# Patient Record
Sex: Female | Born: 2000 | Race: White | Hispanic: No | Marital: Single | Smoking: Never smoker
Health system: Southern US, Community
[De-identification: ages and names within clinical notes are randomized; demographics above are authoritative.]

## PROBLEM LIST (undated history)

## (undated) DIAGNOSIS — G43909 Migraine, unspecified, not intractable, without status migrainosus: Secondary | ICD-10-CM

## (undated) HISTORY — PX: NO PAST SURGERIES: SHX2092

## (undated) HISTORY — DX: Migraine, unspecified, not intractable, without status migrainosus: G43.909

---
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
## 2020-01-01 ENCOUNTER — Other Ambulatory Visit (INDEPENDENT_AMBULATORY_CARE_PROVIDER_SITE_OTHER): Payer: Self-pay | Admitting: Neurology

## 2020-01-29 ENCOUNTER — Other Ambulatory Visit (INDEPENDENT_AMBULATORY_CARE_PROVIDER_SITE_OTHER): Payer: Self-pay | Admitting: Neurology

## 2020-02-01 ENCOUNTER — Encounter (INDEPENDENT_AMBULATORY_CARE_PROVIDER_SITE_OTHER): Payer: Self-pay | Admitting: Neurology

## 2020-02-01 ENCOUNTER — Telehealth (INDEPENDENT_AMBULATORY_CARE_PROVIDER_SITE_OTHER): Payer: 59 | Admitting: Neurology

## 2020-02-01 VITALS — Ht 64.75 in | Wt 153.0 lb

## 2020-02-01 DIAGNOSIS — R519 Headache, unspecified: Secondary | ICD-10-CM

## 2020-02-01 DIAGNOSIS — G44209 Tension-type headache, unspecified, not intractable: Secondary | ICD-10-CM

## 2020-02-01 DIAGNOSIS — G43009 Migraine without aura, not intractable, without status migrainosus: Secondary | ICD-10-CM

## 2020-02-01 MED ORDER — AMITRIPTYLINE HCL 25 MG PO TABS: ORAL_TABLET | ORAL | 5 refills | Status: DC

## 2020-02-01 NOTE — Progress Notes (Signed)
This is a Pediatric Specialist E-Visit follow up consult provided via My Chart Ramond Craver and their parent/guardian   consented to an E-Visit consult today.  Location of patient: [place] is at Home(location) Location of provider: Keturah Shavers, MD is at Office (location) Patient was referred by Gwenlyn Found, MD   The following participants were involved in this E-Visit: Marva Panda, CMA              Keturah Shavers, MD Chief Complain/ Reason for E-Visit today: headache Total time on call: 20 minutes Follow up: Around 4 months in the office  Patient: Vallorie Niccoli MRN: [id number] Sex: female DOB: 2000/04/16  Provider: Keturah Shavers, MD Location of Care: [facility]  Note type: Routine return visit History from: patient and [facility] chart Chief Complaint: Brett Fairy, MD  History of Present Illness: Aneliese Beaudry is a 20 y.o. female is on video for follow-up management of headache and adjusting the medication.  She has been having frequent and chronic migraine and tension type headaches which at some point was very frequent so the dose of her preventive medication amitriptyline increased gradually to 50 mg every night which she was taking regularly until around 2 months ago when she ran out of medication and since then she has not been on any medication. She was last seen in May 2021 when the dose of medication increased and she was doing fairly well with on average 3 or 4 headaches each month but since she discontinued the medication about 2 months ago she has been having more frequent headaches and probably 3 or 4 headaches each week which for some of them she needs to take OTC medications. She usually sleeps well without any difficulty and with no awakening although she mentioned that she was sleeping better when she was taking the amitriptyline.  She did not have any side effects of medication and she thinks that the medication was helping her fairly  significant with the headache frequency and intensity. Currently she is in college and [employer] and doing well otherwise and she would like to start medication again since she is having more frequent headaches.  Review of Systems: 12 system review as per HPI, otherwise negative.  History reviewed. No pertinent past medical history. Hospitalizations: No., Head Injury: No., Nervous System Infections: No., Immunizations up to date: Yes.     Surgical History Past Surgical History:  Procedure Laterality Date  . NO PAST SURGERIES      Family History family history includes ADD / ADHD in her brother, father, and mother; Bipolar disorder in her maternal grandmother and paternal grandfather.   Social History Social History   Socioeconomic History  . Marital status: Single    Spouse name: Not on file  . Number of children: Not on file  . Years of education: Not on file  . Highest education level: Not on file  Occupational History  . Not on file  Tobacco Use  . Smoking status: Passive Smoke Exposure - Never Smoker  . Smokeless tobacco: Never Used  Substance and Sexual Activity  . Alcohol use: Not on file  . Drug use: Not on file  . Sexual activity: Not on file  Other Topics Concern  . Not on file  Social History Narrative   Lives with mom,dad and brother. She attends [employer] She does well in school. She enjoys being outside, swimming, and playing with her dog.   Social Determinants of Health   Financial Resource Strain: Not  on file  Food Insecurity: Not on file  Transportation Needs: Not on file  Physical Activity: Not on file  Stress: Not on file  Social Connections: Not on file     The medication list was reviewed and reconciled. All changes or newly prescribed medications were explained.  A complete medication list was provided to the patient/caregiver.  Allergies  Allergen Reactions  . Other Rash and Other (See Comments)    Mushrooms Mushrooms    Physical Exam Ht 5'  4.75" (1.645 m)   Wt 153 lb (69.4 kg)   BMI 25.66 kg/m  Her very limited exam is unremarkable, she was awake, alert, follows instructions appropriately with normal comprehension and fluent speech.  She had normal cranial nerves with symmetric face and no nystagmus.  She had no tremor with normal range of motion.  Assessment and Plan 1. Migraine without aura and without status migrainosus, not intractable   2. Chronic daily headache   3. Tension headache     This is a 20 year old female with episodes of chronic migraine and tension type headaches with increased intensity and frequency since discontinuing medication a couple of months ago.  She has no findings on her limited neurological exam. I would like to start her on medication again but I would start her on slightly lower dose of medication at 37.5 mg every night and see how she does. She may also benefit from restarting dietary supplements such as magnesium and vitamin B2 or co-Q10. She needs to have more hydration with adequate sleep and limited screen time She will make a headache diary and bring it on her next visit She may take occasional Tylenol or ibuprofen for moderate to severe headache She will call my office if she develops more frequent headaches otherwise I would like to see her in 4 to 5 months for follow-up visit in the office.  She understood and agreed with the plan.    Meds ordered this encounter  Medications  . amitriptyline (ELAVIL) 25 MG tablet    Sig: Take 37.5 mg or 1.5 tablet every night    Dispense:  46 tablet    Refill:  5

## 2020-02-01 NOTE — Patient Instructions (Signed)
Since the headaches are getting more frequent off of medication, I will start the same medication but with a slightly lower dose of 37.5 mg or 1.5 tablet every night Start taking dietary supplements again such as magnesium and co-Q10 Continue with more hydration, adequate sleep and limited screen time Make a headache diary May take occasional Tylenol or ibuprofen for moderate to severe headache Return in 4 months for follow-up visit

## 2020-05-01 ENCOUNTER — Other Ambulatory Visit (INDEPENDENT_AMBULATORY_CARE_PROVIDER_SITE_OTHER): Payer: Self-pay | Admitting: Neurology

## 2020-05-01 NOTE — Telephone Encounter (Signed)
Request for 90 days.

## 2020-08-04 ENCOUNTER — Other Ambulatory Visit: Payer: Self-pay

## 2020-08-04 ENCOUNTER — Encounter: Payer: Self-pay | Admitting: Neurology

## 2020-08-04 ENCOUNTER — Ambulatory Visit (INDEPENDENT_AMBULATORY_CARE_PROVIDER_SITE_OTHER): Payer: 59 | Admitting: Neurology

## 2020-08-04 VITALS — BP 110/70 | HR 94 | Ht 65.0 in | Wt 158.0 lb

## 2020-08-04 DIAGNOSIS — G43709 Chronic migraine without aura, not intractable, without status migrainosus: Secondary | ICD-10-CM | POA: Diagnosis not present

## 2020-08-04 MED ORDER — ONDANSETRON 4 MG PO TBDP: 4.0000 mg | ORAL_TABLET | Freq: Three times a day (TID) | ORAL | 3 refills | Status: DC | PRN

## 2020-08-04 MED ORDER — RIZATRIPTAN BENZOATE 10 MG PO TBDP: 10.0000 mg | ORAL_TABLET | ORAL | 11 refills | Status: DC | PRN

## 2020-08-04 MED ORDER — ONDANSETRON 4 MG PO TBDP: 4.0000 mg | ORAL_TABLET | Freq: Three times a day (TID) | ORAL | 3 refills | Status: AC | PRN

## 2020-08-04 MED ORDER — TOPIRAMATE 50 MG PO TABS: ORAL_TABLET | ORAL | 3 refills | Status: DC

## 2020-08-04 MED ORDER — RIZATRIPTAN BENZOATE 10 MG PO TBDP: 10.0000 mg | ORAL_TABLET | ORAL | 11 refills | Status: AC | PRN

## 2020-08-04 NOTE — Progress Notes (Signed)
[id number]    Provider:  Dr Lucia Gaskins Requesting Provider: Gwenlyn Found, MD Primary Care Provider:  Gwenlyn Found, MD  CC:  migraines  HPI:  Phyllis Williams is a 20 y.o. female here as requested by Gwenlyn Found, MD for migraines. PMHx migraines, chronic daily headache, tension headache. She has been a patient here at [facility] with pediatric neurologist and is establishing care with [facility].She last saw neurology last summer. She has only tried amitriptyline and it is not working. She has 20 headache days a month. She has severe migraines, photo/phonophobia, a dark room helps, nausea, pounding/pulsating, one side behind one eye or either side the front behind the eyes, sleep helps but can last up to 24 hours, moderately severe to severe, 8 migraines days a month going on for years since middle school. No known family hx of migraines, mother does have headaches. No auras.  Reviewed notes, labs and imaging from outside physicians, which showed:   From a thorough review of records, medications tried that can be used in migraine management includes: amitrip, mag ox, b2, continuous birth control, propranolol is contraindicated, sumatriptan   MRI 04/10/2017: showed No acute intracranial abnormalities including mass lesion or mass effect, hydrocephalus, extra-axial fluid collection, midline shift, hemorrhage, or acute infarction, large ischemic events (personally reviewed report, no images available currently); normal.    Review of Systems: Patient complains of symptoms per HPI as well as the following symptoms headaches. Pertinent negatives and positives per HPI. All others negative.   Social History   Socioeconomic History   Marital status: Single    Spouse name: Not on file   Number of children: Not on file   Years of education: Not on file   Highest education level: Not on file  Occupational History   Not on file  Tobacco Use   Smoking status: Never    Passive  exposure: Yes   Smokeless tobacco: Never  Substance and Sexual Activity   Alcohol use: Not on file   Drug use: Not on file   Sexual activity: Not on file  Other Topics Concern   Not on file  Social History Narrative   Lives with mom,dad and brother. She attends incoming sophomore [employer] She does well in school. She enjoys being outside, swimming, and playing with her dog. Caffeine coffee occasionally.  Work self employed.    Social Determinants of Health   Financial Resource Strain: Not on file  Food Insecurity: Not on file  Transportation Needs: Not on file  Physical Activity: Not on file  Stress: Not on file  Social Connections: Not on file  Intimate Partner Violence: Not on file    Family History  Problem Relation Age of Onset   ADD / ADHD Mother    ADD / ADHD Father    ADD / ADHD Brother    Bipolar disorder Maternal Grandmother    Bipolar disorder Paternal Grandfather    Migraines Neg Hx    Seizures Neg Hx    Autism Neg Hx    Anal fissures Neg Hx    Anxiety disorder Neg Hx    Depression Neg Hx    Schizophrenia Neg Hx     Past Medical History:  Diagnosis Date   Migraine     Patient Active Problem List   Diagnosis Date Noted   Changing pigmented skin lesion 09/05/2018   Chronic daily headache 05/05/2017   Migraine without aura and without status migrainosus, not intractable 05/05/2017   Tension headache  05/05/2017    Past Surgical History:  Procedure Laterality Date   NO PAST SURGERIES      Current Outpatient Medications  Medication Sig Dispense Refill   amitriptyline (ELAVIL) 25 MG tablet TAKE 37.5 MG OR 1.5 TABLET EVERY NIGHT 138 tablet 0   medroxyPROGESTERone (DEPO-PROVERA) 150 MG/ML injection Inject 150 mg into the muscle every 3 (three) months.     topiramate (TOPAMAX) 50 MG tablet Start with 1 tab (50mg ) for 2 weeks then increase to 2 tabs (100mg ) at bedtime 180 tablet 3   ondansetron (ZOFRAN-ODT) 4 MG disintegrating tablet Take 1-2 tablets (4-8 mg  total) by mouth every 8 (eight) hours as needed. May take with Rizatriptan. 30 tablet 3   rizatriptan (MAXALT-MLT) 10 MG disintegrating tablet Take 1 tablet (10 mg total) by mouth as needed for migraine. May repeat in 2 hours if needed. May take with ondansetron and over the counter analgesics (advil, excedrin, tylenol etc) 9 tablet 11   No current facility-administered medications for this visit.    Allergies as of 08/04/2020 - Review Complete 08/04/2020  Allergen Reaction Noted   Other Rash and Other (See Comments) 05/05/2017    Vitals: BP 110/70   Pulse 94   Ht 5\' 5"  (1.651 m)   Wt 158 lb (71.7 kg)   BMI 26.29 kg/m  Last Weight:  Wt Readings from Last 1 Encounters:  08/04/20 158 lb (71.7 kg) (86 %, Z= 1.08)*   * Growth percentiles are based on CDC (Girls, 2-20 Years) data.   Last Height:   Ht Readings from Last 1 Encounters:  08/04/20 5\' 5"  (1.651 m) (61 %, Z= 0.28)*   * Growth percentiles are based on CDC (Girls, 2-20 Years) data.     Physical exam: Exam: Gen: NAD, conversant, well nourised, well groomed                     CV: RRR, no MRG. No Carotid Bruits. No peripheral edema, warm, nontender Eyes: Conjunctivae clear without exudates or hemorrhage  Neuro: Detailed Neurologic Exam  Speech:    Speech is normal; fluent and spontaneous with normal comprehension.  Cognition:    The patient is oriented to person, place, and time;     recent and remote memory intact;     language fluent;     normal attention, concentration,     fund of knowledge Cranial Nerves:    The pupils are equal, round, and reactive to light. The fundi are normal and spontaneous venous pulsations are present. Visual fields are full to finger confrontation. Extraocular movements are intact. Trigeminal sensation is intact and the muscles of mastication are normal. The face is symmetric. The palate elevates in the midline. Hearing intact. Voice is normal. Shoulder shrug is normal. The tongue has  normal motion without fasciculations.   Coordination:    Normal   Gait:    normal.   Motor Observation:    No asymmetry, no atrophy, and no involuntary movements noted. Tone:    Normal muscle tone.    Posture:    Posture is normal. normal erect    Strength:    Strength is V/V in the upper and lower limbs.      Sensation: intact to LT     Reflex Exam:  DTR's:    Deep tendon reflexes in the upper and lower extremities are normal bilaterally.   Toes:    The toes are downgoing bilaterally.   Clonus:    Clonus is absent.  Assessment/Plan:  Patient with Chronic Migraines.   Prevention: Topamax start 50mg  at bedtime and in 2-3 weeks if no side effects 100mg .  If Topamax ineffective or has significant side effects switch to the Emgality or Ajovy  Acute management: Maxalt/Rizatriptan and ondansetron  Extended visit, new migraine patient, reviewed migraines and migraine management:  To prevent or relieve headaches, try the following: Cool Compress. Lie down and place a cool compress on your head.  Avoid headache triggers. If certain foods or odors seem to have triggered your migraines in the past, avoid them. A headache diary might help you identify triggers.  Include physical activity in your daily routine. Try a daily walk or other moderate aerobic exercise.  Manage stress. Find healthy ways to cope with the stressors, such as delegating tasks on your to-do list.  Practice relaxation techniques. Try deep breathing, yoga, massage and visualization.  Eat regularly. Eating regularly scheduled meals and maintaining a healthy diet might help prevent headaches. Also, drink plenty of fluids.  Follow a regular sleep schedule. Sleep deprivation might contribute to headaches Consider biofeedback. With this mind-body technique, you learn to control certain bodily functions -- such as muscle tension, heart rate and blood pressure -- to prevent headaches or reduce headache pain.     Proceed to emergency room if you experience new or worsening symptoms or symptoms do not resolve, if you have new neurologic symptoms or if headache is severe, or for any concerning symptom.   Provided education and documentation from American headache Society toolbox including articles on: chronic migraine medication overuse headache, chronic migraines, prevention of migraines, behavioral and other nonpharmacologic treatments for headache.   Orders Placed This Encounter  Procedures   Comprehensive metabolic panel   CBC with Differential/Platelets   TSH    Meds ordered this encounter  Medications   DISCONTD: rizatriptan (MAXALT-MLT) 10 MG disintegrating tablet    Sig: Take 1 tablet (10 mg total) by mouth as needed for migraine. May repeat in 2 hours if needed    Dispense:  9 tablet    Refill:  11   DISCONTD: ondansetron (ZOFRAN-ODT) 4 MG disintegrating tablet    Sig: Take 1-2 tablets (4-8 mg total) by mouth every 8 (eight) hours as needed.    Dispense:  30 tablet    Refill:  3   topiramate (TOPAMAX) 50 MG tablet    Sig: Start with 1 tab (50mg ) for 2 weeks then increase to 2 tabs (100mg ) at bedtime    Dispense:  180 tablet    Refill:  3   ondansetron (ZOFRAN-ODT) 4 MG disintegrating tablet    Sig: Take 1-2 tablets (4-8 mg total) by mouth every 8 (eight) hours as needed. May take with Rizatriptan.    Dispense:  30 tablet    Refill:  3    Please [place] in [place]   rizatriptan (MAXALT-MLT) 10 MG disintegrating tablet    Sig: Take 1 tablet (10 mg total) by mouth as needed for migraine. May repeat in 2 hours if needed. May take with ondansetron and over the counter analgesics (advil, excedrin, tylenol etc)    Dispense:  9 tablet    Refill:  11    Please fill [place]     Cc: Eksir, Nira RetortSamantha A, MD,  Gwenlyn FoundEksir, Samantha A, MD  Naomie DeanAntonia Johann Gascoigne, MD  [facility] [address] [place], [place]  [id number]  Phone [phone] Fax [phone]  I spent 60 minutes of face-to-face  and non-face-to-face time with patient on the  1. Chronic migraine without aura without status migrainosus, not intractable    diagnosis.  This included previsit chart review, lab review, study review, order entry, electronic health record documentation, patient education on the different diagnostic and therapeutic options, counseling and coordination of care, risks and benefits of management, compliance, or risk factor reduction

## 2020-08-04 NOTE — Patient Instructions (Addendum)
[email or web address] (dry needling) for neck muscles  - Acute/Emergency: Rizatriptan: Please take one tablet at the onset of your headache. If it does not improve the symptoms please take one additional tablet. Do not take more then 2 tablets in 24hrs. Do not take use more then 2 to 3 times in a week. - Ondansetron: May take with the Rizatriptan or alone for nausea. - For acute/emergency may also consider Nurtec or Ubrelvy In the furutre or a trial of other triptans.  Preventative: Topiramate. Next would try Emgality or Ajovy.   Rizatriptan Disintegrating Tablets What is this medication? RIZATRIPTAN (rye za TRIP tan) treats migraines. It works by blocking pain signals and narrowing blood vessels in the brain. It belongs to a group ofmedications called triptans. It is not used to prevent migraines. This medicine may be used for other purposes; ask your health care provider orpharmacist if you have questions. COMMON BRAND NAME(S): Maxalt-MLT What should I tell my care team before I take this medication? They need to know if you have any of these conditions: Cigarette smoker Circulation problems in fingers and toes Diabetes Heart disease High blood pressure High cholesterol History of irregular heartbeat History of stroke Kidney disease Liver disease Stomach or intestine problems An unusual or allergic reaction to rizatriptan, other medications, foods, dyes, or preservatives Pregnant or trying to get pregnant Breast-feeding How should I use this medication? Take this medication by mouth. Follow the directions on the prescription label. Leave the tablet in the sealed blister pack until you are ready to take it. With dry hands, open the blister and gently remove the tablet. If the tablet breaks or crumbles, throw it away and take a new tablet out of the blister pack. Place the tablet in the mouth and allow it to dissolve, and then swallow. Do not cut, crush, or chew this medication. You do not  need water to take thismedication. Do not take it more often than directed. Talk to your care team regarding the use of this medication in children. While this medication may be prescribed for children as young as 6 years for selectedconditions, precautions do apply. Overdosage: If you think you have taken too much of this medicine contact apoison control center or emergency room at once. NOTE: This medicine is only for you. Do not share this medicine with others. What if I miss a dose? This does not apply. This medication is not for regular use. What may interact with this medication? Do not take this medication with any of the following medications: Certain medications for migraine headache like almotriptan, eletriptan, frovatriptan, naratriptan, rizatriptan, sumatriptan, zolmitriptan Ergot alkaloids like dihydroergotamine, ergonovine, ergotamine, methylergonovine MAOIs like Carbex, Eldepryl, Marplan, Nardil, and Parnate This medication may also interact with the following medications: Certain medications for depression, anxiety, or psychotic disorders Propranolol This list may not describe all possible interactions. Give your health care provider a list of all the medicines, herbs, non-prescription drugs, or dietary supplements you use. Also tell them if you smoke, drink alcohol, or use illegaldrugs. Some items may interact with your medicine. What should I watch for while using this medication? Visit your care team for regular checks on your progress. Tell your care teamif your symptoms do not start to get better or if they get worse. You may get drowsy or dizzy. Do not drive, use machinery, or do anything that needs mental alertness until you know how this medication affects you. Do not stand up or sit up quickly, especially if you are an  older patient. This reduces the risk of dizzy or fainting spells. Alcohol may interfere with theeffect of this medication. Your mouth may get dry. Chewing  sugarless gum or sucking hard candy and drinking plenty of water may help. Contact your care team if the problem doesnot go away or is severe. If you take migraine medications for 10 or more days a month, your migraines may get worse. Keep a diary of headache days and medication use. Contact yourcare team if your migraine attacks occur more frequently. What side effects may I notice from receiving this medication? Side effects that you should report to your care team as soon as possible: Allergic reactions-skin rash, itching, hives, swelling of the face, lips, tongue, or throat Burning, pain, tingling, or color changes in the legs or feet Heart attack-pain or tightness in the chest, shoulders, arms, or jaw, nausea, shortness of breath, cold or clammy skin, feeling faint or lightheaded Heart rhythm changes-fast or irregular heartbeat, dizziness, feeling faint or lightheaded, chest pain, trouble breathing Increase in blood pressure Irritability, confusion, fast or irregular heartbeat, muscle stiffness, twitching muscles, sweating, high fever, seizure, chills, vomiting, diarrhea, which may be signs of serotonin syndrome Raynaud's-cool, numb, or painful fingers or toes that may change color from pale, to blue, to red Seizures Stroke-sudden numbness or weakness of the face, arm, or leg, trouble speaking, confusion, trouble walking, loss of balance or coordination, dizziness, severe headache, change in vision Sudden or severe stomach pain, nausea, vomiting, fever, or bloody diarrhea Vision loss Side effects that usually do not require medical attention (report to your careteam if they continue or are bothersome): Dizziness General discomfort or fatigue This list may not describe all possible side effects. Call your doctor for medical advice about side effects. You may report side effects to FDA at1-800-FDA-1088. Where should I keep my medication? Keep out of the reach of children and pets. Store at  room temperature between 15 and 30 degrees C (59 and 86 degrees F). Protect from light and moisture. Throw away any unused medication after theexpiration date. NOTE: This sheet is a summary. It may not cover all possible information. If you have questions about this medicine, talk to your doctor, pharmacist, orhealth care provider.  2022 Elsevier/Gold Standard (2020-01-30 16:19:19) Ondansetron Dissolving Tablets What is this medication? ONDANSETRON (on DAN se tron) prevents nausea and vomiting from chemotherapy, radiation, or surgery. It works by blocking substances in the body that may cause nausea or vomiting. It belongs to a group of medications calledantiemetics. This medicine may be used for other purposes; ask your health care provider orpharmacist if you have questions. COMMON BRAND NAME(S): Zofran ODT What should I tell my care team before I take this medication? They need to know if you have any of these conditions: Heart disease History of irregular heartbeat Liver disease Low levels of magnesium or potassium in the blood An unusual or allergic reaction to ondansetron, granisetron, other medications, foods, dyes, or preservatives Pregnant or trying to get pregnant Breast-feeding How should I use this medication? These tablets are made to dissolve in the mouth. Do not try to push the tablet through the foil backing. With dry hands, peel away the foil backing and gently remove the tablet. Place the tablet in the mouth and allow it to dissolve, then swallow. While you may take these tablets with water, it is not necessary to doso. Talk to your care team regarding the use of this medication in children.Special care may be needed. Overdosage: If you  think you have taken too much of this medicine contact apoison control center or emergency room at once. NOTE: This medicine is only for you. Do not share this medicine with others. What if I miss a dose? If you miss a dose, take it as soon  as you can. If it is almost time for yournext dose, take only that dose. Do not take double or extra doses. What may interact with this medication? Do not take this medication with any of the following: Apomorphine Certain medications for fungal infections like fluconazole, itraconazole, ketoconazole, posaconazole, voriconazole Cisapride Dronedarone Pimozide Thioridazine This medication may also interact with the following: Carbamazepine Certain medications for depression, anxiety, or psychotic disturbances Fentanyl Linezolid MAOIs like Carbex, Eldepryl, Marplan, Nardil, and Parnate Methylene blue (injected into a vein) Other medications that prolong the QT interval (cause an abnormal heart rhythm) like dofetilide, ziprasidone Phenytoin Rifampicin Tramadol This list may not describe all possible interactions. Give your health care provider a list of all the medicines, herbs, non-prescription drugs, or dietary supplements you use. Also tell them if you smoke, drink alcohol, or use illegaldrugs. Some items may interact with your medicine. What should I watch for while using this medication? Check with your care team as soon as you can if you have any sign of anallergic reaction. What side effects may I notice from receiving this medication? Side effects that you should report to your care team as soon as possible: Allergic reactions-skin rash, itching, hives, swelling of the face, lips, tongue, or throat Bowel blockage-stomach cramping, unable to have a bowel movement or pass gas, loss of appetite, vomiting Chest pain (angina)-pain, pressure, or tightness in the chest, neck, back, or arms Heart rhythm changes-fast or irregular heartbeat, dizziness, feeling faint or lightheaded, chest pain, trouble breathing Irritability, confusion, fast or irregular heartbeat, muscle stiffness, twitching muscles, sweating, high fever, seizure, chills, vomiting, diarrhea, which may be signs of serotonin  syndrome Side effects that usually do not require medical attention (report to your careteam if they continue or are bothersome): Constipation Diarrhea General discomfort and fatigue Headache This list may not describe all possible side effects. Call your doctor for medical advice about side effects. You may report side effects to FDA at1-800-FDA-1088. Where should I keep my medication? Keep out of the reach of children and pets. Store between 2 and 30 degrees C (36 and 86 degrees F). Throw away any unusedmedication after the expiration date. NOTE: This sheet is a summary. It may not cover all possible information. If you have questions about this medicine, talk to your doctor, pharmacist, orhealth care provider.  2022 Elsevier/Gold Standard (2020-01-25 14:39:19) Topiramate tablets What is this medication? TOPIRAMATE (toe PYRE a mate) treats seizures in people with epilepsy. It is also used to prevent migraines. It works by calming overactive nerves in yourbody. This medicine may be used for other purposes; ask your health care provider orpharmacist if you have questions. COMMON BRAND NAME(S): Topamax, Topiragen What should I tell my care team before I take this medication? They need to know if you have any of these conditions: Bleeding disorder Kidney disease Lung disease Suicidal thoughts, plans or attempt An unusual or allergic reaction to topiramate, other medications, foods, dyes, or preservatives Pregnant or trying to get pregnant Breast-feeding How should I use this medication? Take this medication by mouth with water. Take it as directed on the prescription label at the same time every day. Do not cut, crush or chew this medicine. Swallow the tablets whole.  You can take it with or without food. If it upsets your stomach, take it with food. Keep taking it unless your care teamtells you to stop. A special MedGuide will be given to you by the pharmacist with eachprescription and  refill. Be sure to read this information carefully each time. Talk to your care team about the use of this medication in children. While it may be prescribed for children as young as 2 years for selected conditions,precautions do apply. Overdosage: If you think you have taken too much of this medicine contact apoison control center or emergency room at once. NOTE: This medicine is only for you. Do not share this medicine with others. What if I miss a dose? If you miss a dose, take it as soon as you can unless it is within 6 hours of the next dose. If it is within 6 hours of the next dose, skip the missed dose.Take the next dose at the normal time. Do not take double or extra doses. What may interact with this medication? Acetazolamide Alcohol Antihistamines for allergy, cough, and cold Aspirin and aspirin-like medications Atropine Birth control pills Certain medications for anxiety or sleep Certain medications for bladder problems like oxybutynin, tolterodine Certain medications for depression like amitriptyline, fluoxetine, sertraline Certain medications for Parkinson's disease like benztropine, trihexyphenidyl Certain medications for seizures like carbamazepine, lamotrigine, phenobarbital, phenytoin, primidone, valproic acid, zonisamide Certain medications for stomach problems like dicyclomine, hyoscyamine Certain medications for travel sickness like scopolamine Certain medications that treat or prevent blood clots like warfarin, enoxaparin, dalteparin, apixaban, dabigatran, and rivaroxaban Digoxin Diltiazem General anesthetics like halothane, isoflurane, methoxyflurane, propofol Glyburide Hydrochlorothiazide Ipratropium Lithium Medications that relax muscles Metformin Narcotic medications for pain NSAIDs, medications for pain and inflammation, like ibuprofen or naproxen Phenothiazines like chlorpromazine, mesoridazine, prochlorperazine, thioridazine Pioglitazone This list may not  describe all possible interactions. Give your health care provider a list of all the medicines, herbs, non-prescription drugs, or dietary supplements you use. Also tell them if you smoke, drink alcohol, or use illegaldrugs. Some items may interact with your medicine. What should I watch for while using this medication? Visit your care team for regular checks on your progress. Tell your care teamif your symptoms do not start to get better or if they get worse. Do not suddenly stop taking this medication. You may develop a severe reaction. Your care team will tell you how much medication to take. If your care team wants you to stop the medication, the dose may be slowly lowered over time toavoid any side effects. Wear a medical ID bracelet or chain. Carry a card that describes yourcondition. List the medications and doses you take on the card. You may get drowsy or dizzy. Do not drive, use machinery, or do anything that needs mental alertness until you know how this medication affects you. Do not stand up or sit up quickly, especially if you are an older patient. This reduces the risk of dizzy or fainting spells. Alcohol may interfere with theeffects of this medication. Avoid alcoholic drinks. This medication may cause serious skin reactions. They can happen weeks to months after starting the medication. Contact your care team right away if you notice fevers or flu-like symptoms with a rash. The rash may be red or purple and then turn into blisters or peeling of the skin. Or, you might notice a red rash with swelling of the face, lips or lymph nodes in your neck or under yourarms. Watch for new or worsening thoughts of suicide  or depression. This includes sudden changes in mood, behaviors, or thoughts. These changes can happen at any time but are more common in the beginning of treatment or after a change in dose. Call your care team right away if you experience these thoughts orworsening depression. This  medication may slow your child's growth if it is taken for a long time athigh doses. Your care team will monitor your child's growth. Using this medication for a long time may weaken your bones. The risk of bonefractures may be increased. Talk to your care team about your bone health. Do not become pregnant while taking this medication. Hormone forms of birth control may not work as well with this medication. Talk to your care team about other forms of birth control. There is potential for serious harm to an unbornchild. Tell your care team right away if you think you might be pregnant. What side effects may I notice from receiving this medication? Side effects that you should report to your care team as soon as possible: Allergic reactions-skin rash, itching, hives, swelling of the face, lips, tongue, or throat High acid levels-trouble breathing, fast irregular heartbeat, headache, confusion, unusually weak or tired, nausea, vomiting High ammonia levels-unusual weakness or fatigue, confusion, loss of appetite, nausea, vomiting, seizures High fever, fever that does not go away, or decreased sweating Kidney stones-blood in the urine, pain or trouble passing urine, pain in the lower back or sides Redness, blistering, peeling or loosening of the skin, including inside the mouth Sudden eye pain or change in vision such as blurry vision, seeing halos around lights, vision loss Thoughts of suicide or self-harm, worsening mood, feelings of depression Side effects that usually do not require medical attention (report to your careteam if they continue or are bothersome): Anxiety, nervousness Change in taste Diarrhea Dizziness Drowsiness Fatigue Loss of appetite with weight loss Pain, tingling, or numbness in the hands or feet Trouble concentrating Trouble speaking This list may not describe all possible side effects. Call your doctor for medical advice about side effects. You may report side effects  to FDA at1-800-FDA-1088. Where should I keep my medication? Keep out of the reach of children and pets. Store between 15 and 30 degrees C (59 and 86 degrees F). Protect from moisture. Keep the container tightly closed. Get rid of any unused medication after theexpiration date. To get rid of medications that are no longer needed or have expired: Take the medication to a medication take-back program. Check with your pharmacy or law enforcement to find a location. If you cannot return the medication, check the label or package insert to see if the medication should be thrown out in the garbage or flushed down the toilet. If you are not sure, ask your care team. If it is safe to put it in the trash, empty the medication out of the container. Mix the medication with cat litter, dirt, coffee grounds, or other unwanted substance. Seal the mixture in a bag or container. Put it in the trash. NOTE: This sheet is a summary. It may not cover all possible information. If you have questions about this medicine, talk to your doctor, pharmacist, orhealth care provider.  2022 Elsevier/Gold Standard (2020-02-18 10:05:08)

## 2020-08-05 LAB — CBC WITH DIFFERENTIAL/PLATELET
Basophils Absolute: 0 10*3/uL (ref 0.0–0.2)
Basos: 1 %
EOS (ABSOLUTE): 0.2 10*3/uL (ref 0.0–0.4)
Eos: 3 %
Hematocrit: 39.7 % (ref 34.0–46.6)
Hemoglobin: 12.6 g/dL (ref 11.1–15.9)
Immature Grans (Abs): 0 10*3/uL (ref 0.0–0.1)
Immature Granulocytes: 0 %
Lymphocytes Absolute: 2.1 10*3/uL (ref 0.7–3.1)
Lymphs: 38 %
MCH: 27.8 pg (ref 26.6–33.0)
MCHC: 31.7 g/dL (ref 31.5–35.7)
MCV: 88 fL (ref 79–97)
Monocytes Absolute: 0.7 10*3/uL (ref 0.1–0.9)
Monocytes: 11 %
Neutrophils Absolute: 2.7 10*3/uL (ref 1.4–7.0)
Neutrophils: 47 %
Platelets: 301 10*3/uL (ref 150–450)
RBC: 4.53 x10E6/uL (ref 3.77–5.28)
RDW: 13 % (ref 11.7–15.4)
WBC: 5.7 10*3/uL (ref 3.4–10.8)

## 2020-08-05 LAB — COMPREHENSIVE METABOLIC PANEL
ALT: 16 IU/L (ref 0–32)
AST: 31 IU/L (ref 0–40)
Albumin/Globulin Ratio: 1.6 (ref 1.2–2.2)
Albumin: 4.5 g/dL (ref 3.9–5.0)
Alkaline Phosphatase: 60 IU/L (ref 42–106)
BUN/Creatinine Ratio: 12 (ref 9–23)
BUN: 10 mg/dL (ref 6–20)
Bilirubin Total: 0.6 mg/dL (ref 0.0–1.2)
CO2: 21 mmol/L (ref 20–29)
Calcium: 9.6 mg/dL (ref 8.7–10.2)
Chloride: 102 mmol/L (ref 96–106)
Creatinine, Ser: 0.81 mg/dL (ref 0.57–1.00)
Globulin, Total: 2.8 g/dL (ref 1.5–4.5)
Glucose: 75 mg/dL (ref 65–99)
Potassium: 4.4 mmol/L (ref 3.5–5.2)
Sodium: 138 mmol/L (ref 134–144)
Total Protein: 7.3 g/dL (ref 6.0–8.5)
eGFR: 107 mL/min/{1.73_m2} (ref >59)

## 2020-08-05 LAB — TSH: TSH: 0.807 u[IU]/mL (ref 0.450–4.500)

## 2020-08-28 ENCOUNTER — Ambulatory Visit: Payer: 59 | Admitting: Neurology

## 2020-12-08 ENCOUNTER — Telehealth: Payer: Self-pay | Admitting: *Deleted

## 2020-12-08 ENCOUNTER — Telehealth: Payer: 59 | Admitting: Neurology

## 2020-12-08 NOTE — Telephone Encounter (Signed)
Pt no showed for her mychart VV. X 1.

## 2020-12-15 ENCOUNTER — Encounter: Payer: Self-pay | Admitting: Neurology

## 2021-01-20 ENCOUNTER — Other Ambulatory Visit: Payer: Self-pay | Admitting: Neurology

## 2021-01-20 ENCOUNTER — Encounter: Payer: Self-pay | Admitting: Neurology

## 2021-01-20 ENCOUNTER — Telehealth: Payer: Self-pay | Admitting: *Deleted

## 2021-01-20 ENCOUNTER — Other Ambulatory Visit: Payer: Self-pay

## 2021-01-20 ENCOUNTER — Ambulatory Visit: Payer: 59 | Admitting: Neurology

## 2021-01-20 VITALS — BP 110/68 | HR 83 | Ht 65.0 in | Wt 150.5 lb

## 2021-01-20 DIAGNOSIS — G43709 Chronic migraine without aura, not intractable, without status migrainosus: Secondary | ICD-10-CM

## 2021-01-20 DIAGNOSIS — R0981 Nasal congestion: Secondary | ICD-10-CM

## 2021-01-20 DIAGNOSIS — J302 Other seasonal allergic rhinitis: Secondary | ICD-10-CM

## 2021-01-20 DIAGNOSIS — J338 Other polyp of sinus: Secondary | ICD-10-CM | POA: Diagnosis not present

## 2021-01-20 DIAGNOSIS — R0683 Snoring: Secondary | ICD-10-CM

## 2021-01-20 MED ORDER — AJOVY 225 MG/1.5ML ~~LOC~~ SOAJ: 225.0000 mg | SUBCUTANEOUS | 11 refills | Status: DC

## 2021-01-20 NOTE — Telephone Encounter (Signed)
Completed Ajovy PA on Cover My Meds. Key: [id number]. Awaiting determination from [facility].

## 2021-01-20 NOTE — Progress Notes (Signed)
[id number]    Provider:  Dr Jaynee Eagles Requesting Provider: Nickola Major, MD Primary Care Provider:  Nickola Major, MD  CC:  migraines  January 20, 2021: Patient comes back for migraines.  She tried the Topamax for 3 months, she could not tolerate it, it gave her brain fog and severe pain in her hands and fingers with tingling, she is tried amitriptyline in the past with significant sedation, propranolol is contraindicated due to hypotension, the Maxalt however does work really well with the Zofran acutely.  But her migraines are worse, again they can be unilateral, pulsating pounding throbbing, nausea, photophobia phonophobia, they can last 12-24 hours, ongoing greater than 6 months with 15 moderate to severe migraine days a month now and at least 20 headache days a month, worsening, no aura, no medication overuse, significantly affecting her life.  HPI:  Phyllis Williams is a 21 y.o. female here as requested by Nickola Major, MD for migraines. PMHx migraines, chronic daily headache, tension headache. She has been a patient here at [facility] with pediatric neurologist and is establishing care with [facility].She last saw neurology last summer. She has only tried amitriptyline and it is not working. She has 20 headache days a month. She has severe migraines, photo/phonophobia, a dark room helps, nausea, pounding/pulsating, one side behind one eye or either side the front behind the eyes, sleep helps but can last up to 24 hours, moderately severe to severe, 8 migraines days a month going on for years since middle school. No known family hx of migraines, mother does have headaches. No auras.  Reviewed notes, labs and imaging from outside physicians, which showed:   From a thorough review of records, medications tried that can be used in migraine management includes: amitrip(sedation), mag ox, b2, continuous birth control, propranolol is contraindicated due ot hypotensio, sumatriptan,  topamax(side effects)   MRI 04/10/2017: showed No acute intracranial abnormalities including mass lesion or mass effect, hydrocephalus, extra-axial fluid collection, midline shift, hemorrhage, or acute infarction, large ischemic events (personally reviewed report, no images available currently); normal. Reviwed images persnally and agee and today reviewed images with patient.    Review of Systems: Patient complains of symptoms per HPI as well as the following symptoms: paresthesias . Pertinent negatives and positives per HPI. All others negative    Social History   Socioeconomic History   Marital status: Single    Spouse name: Not on file   Number of children: Not on file   Years of education: Not on file   Highest education level: Not on file  Occupational History   Not on file  Tobacco Use   Smoking status: Never    Passive exposure: Yes   Smokeless tobacco: Never  Substance and Sexual Activity   Alcohol use: Yes    Alcohol/week: 9.0 standard drinks    Types: 6 Cans of beer, 3 Shots of liquor per week   Drug use: Yes    Types: Marijuana   Sexual activity: Not on file  Other Topics Concern   Not on file  Social History Narrative   Lives with mom,dad and brother. She attends incoming sophomore [employer] She does well in school. She enjoys being outside, swimming, and playing with her dog. Caffeine coffee occasionally.  Work self employed.    Social Determinants of Health   Financial Resource Strain: Not on file  Food Insecurity: Not on file  Transportation Needs: Not on file  Physical Activity: Not on file  Stress: Not  on file  Social Connections: Not on file  Intimate Partner Violence: Not on file    Family History  Problem Relation Age of Onset   ADD / ADHD Mother    ADD / ADHD Father    ADD / ADHD Brother    Bipolar disorder Maternal Grandmother    Bipolar disorder Paternal Grandfather    Migraines Neg Hx    Seizures Neg Hx    Autism Neg Hx    Anal fissures Neg  Hx    Anxiety disorder Neg Hx    Depression Neg Hx    Schizophrenia Neg Hx     Past Medical History:  Diagnosis Date   Migraine     Patient Active Problem List   Diagnosis Date Noted   Changing pigmented skin lesion 09/05/2018   Chronic daily headache 05/05/2017   Migraine without aura and without status migrainosus, not intractable 05/05/2017   Tension headache 05/05/2017    Past Surgical History:  Procedure Laterality Date   NO PAST SURGERIES      Current Outpatient Medications  Medication Sig Dispense Refill   Fremanezumab-vfrm (AJOVY) 225 MG/1.5ML SOAJ Inject 225 mg into the skin every 30 (thirty) days. 1.5 mL 11   medroxyPROGESTERone (DEPO-PROVERA) 150 MG/ML injection Inject 150 mg into the muscle every 3 (three) months.     ondansetron (ZOFRAN-ODT) 4 MG disintegrating tablet Take 1-2 tablets (4-8 mg total) by mouth every 8 (eight) hours as needed. May take with Rizatriptan. 30 tablet 3   rizatriptan (MAXALT-MLT) 10 MG disintegrating tablet Take 1 tablet (10 mg total) by mouth as needed for migraine. May repeat in 2 hours if needed. May take with ondansetron and over the counter analgesics (advil, excedrin, tylenol etc) 9 tablet 11   No current facility-administered medications for this visit.    Allergies as of 01/20/2021 - Review Complete 01/20/2021  Allergen Reaction Noted   Other Rash and Other (See Comments) 05/05/2017    Vitals: BP 110/68    Pulse 83    Ht 5\' 5"  (1.651 m)    Wt 150 lb 8 oz (68.3 kg)    BMI 25.04 kg/m  Last Weight:  Wt Readings from Last 1 Encounters:  01/20/21 150 lb 8 oz (68.3 kg)   Last Height:   Ht Readings from Last 1 Encounters:  01/20/21 5\' 5"  (1.651 m)   Exam: NAD, pleasant                  Speech:    Speech is normal; fluent and spontaneous with normal comprehension.  Cognition:    The patient is oriented to person, place, and time;     recent and remote memory intact;     language fluent;    Cranial Nerves:    The pupils  are equal, round, and reactive to light.Trigeminal sensation is intact and the muscles of mastication are normal. The face is symmetric. The palate elevates in the midline. Hearing intact. Voice is normal. Shoulder shrug is normal. The tongue has normal motion without fasciculations.   Coordination:  No dysmetria  Motor Observation:    No asymmetry, no atrophy, and no involuntary movements noted. Tone:    Normal muscle tone.     Strength:    Strength is V/V in the upper and lower limbs.      Sensation: intact to LT     Assessment/Plan:  Patient with Chronic Migraines. Failed multiple classes of meds.   Chronic allergies, drainage, seasonal but has them  every season, starts migraines: Allergy referral.   Patient comes back for migraines.  She tried the Topamax for 3 months, she could not tolerate it, it gave her brain fog and severe pain in her hands and fingers with tingling, she is tried amitriptyline in the past with significant sedation, propranolol is contraindicated due to hypotension, the Maxalt however does work really well with the Zofran acutely.  But her migraines are worse, again they can be unilateral, pulsating pounding throbbing, nausea, photophobia phonophobia, they can last 12-24 hours, ongoing greater than 6 months with 15 moderate to severe migraine days a month now and at least 20 headache days a month, worsening, no aura, no medication overuse, significantly affecting her life.  Prevention: Start Ajovy, discussed teratgenicity do no tget pregnant for 6 months after stopping  Acute management: Maxalt/Rizatriptan and ondansetron  To prevent or relieve headaches, try the following: Cool Compress. Lie down and place a cool compress on your head.  Avoid headache triggers. If certain foods or odors seem to have triggered your migraines in the past, avoid them. A headache diary might help you identify triggers.  Include physical activity in your daily routine. Try a daily  walk or other moderate aerobic exercise.  Manage stress. Find healthy ways to cope with the stressors, such as delegating tasks on your to-do list.  Practice relaxation techniques. Try deep breathing, yoga, massage and visualization.  Eat regularly. Eating regularly scheduled meals and maintaining a healthy diet might help prevent headaches. Also, drink plenty of fluids.  Follow a regular sleep schedule. Sleep deprivation might contribute to headaches Consider biofeedback. With this mind-body technique, you learn to control certain bodily functions -- such as muscle tension, heart rate and blood pressure -- to prevent headaches or reduce headache pain.    Proceed to emergency room if you experience new or worsening symptoms or symptoms do not resolve, if you have new neurologic symptoms or if headache is severe, or for any concerning symptom.   Provided education and documentation from American headache Society toolbox including articles on: chronic migraine medication overuse headache, chronic migraines, prevention of migraines, behavioral and other nonpharmacologic treatments for headache.   Orders Placed This Encounter  Procedures   Ambulatory referral to Allergy    Meds ordered this encounter  Medications   Fremanezumab-vfrm (AJOVY) 225 MG/1.5ML SOAJ    Sig: Inject 225 mg into the skin every 30 (thirty) days.    Dispense:  1.5 mL    Refill:  11    Patient has copay card; she can have medication regardless of insurance approval or copay amount.     Cc: Nickola Major, MD,  Nickola Major, MD  Sarina Ill, MD  [facility] [address] [place], [place] [address]  Phone [phone] Fax [phone]  I spent over 30 minutes of face-to-face and non-face-to-face time with patient on the  1. Chronic migraine without aura without status migrainosus, not intractable   2. Congestion of nasal sinus   3. Antral (maxillary) polyp   4.  Seasonal allergies   5. Snoring     diagnosis.  This included previsit chart review, lab review, study review, order entry, electronic health record documentation, patient education on the different diagnostic and therapeutic options, counseling and coordination of care, risks and benefits of management, compliance, or risk factor reduction

## 2021-01-20 NOTE — Patient Instructions (Signed)
°  Fremanezumab injection What is this medication? FREMANEZUMAB (fre ma NEZ ue mab) is used to prevent migraine headaches. This medicine may be used for other purposes; ask your health care provider or pharmacist if you have questions. COMMON BRAND NAME(S): AJOVY What should I tell my care team before I take this medication? They need to know if you have any of these conditions: an unusual or allergic reaction to fremanezumab, other medicines, foods, dyes, or preservatives pregnant or trying to get pregnant breast-feeding How should I use this medication? This medicine is for injection under the skin. You will be taught how to prepare and give this medicine. Use exactly as directed. Take your medicine at regular intervals. Do not take your medicine more often than directed. It is important that you put your used needles and syringes in a special sharps container. Do not put them in a trash can. If you do not have a sharps container, call your pharmacist or healthcare provider to get one. Talk to your pediatrician regarding the use of this medicine in children. Special care may be needed. Overdosage: If you think you have taken too much of this medicine contact a poison control center or emergency room at once. NOTE: This medicine is only for you. Do not share this medicine with others. What if I miss a dose? If you miss a dose, take it as soon as you can. If it is almost time for your next dose, take only that dose. Do not take double or extra doses. What may interact with this medication? Interactions are not expected. This list may not describe all possible interactions. Give your health care provider a list of all the medicines, herbs, non-prescription drugs, or dietary supplements you use. Also tell them if you smoke, drink alcohol, or use illegal drugs. Some items may interact with your medicine. What should I watch for while using this medication? Tell your doctor or healthcare  professional if your symptoms do not start to get better or if they get worse. What side effects may I notice from receiving this medication? Side effects that you should report to your doctor or health care professional as soon as possible: allergic reactions like skin rash, itching or hives, swelling of the face, lips, or tongue Side effects that usually do not require medical attention (report these to your doctor or health care professional if they continue or are bothersome): pain, redness, or irritation at site where injected This list may not describe all possible side effects. Call your doctor for medical advice about side effects. You may report side effects to FDA at 1-800-FDA-1088. Where should I keep my medication? Keep out of the reach of children. You will be instructed on how to store this medicine. Throw away any unused medicine after the expiration date on the label. NOTE: This sheet is a summary. It may not cover all possible information. If you have questions about this medicine, talk to your doctor, pharmacist, or health care provider.  2022 Elsevier/Gold Standard (2016-10-05 00:00:00)

## 2021-01-20 NOTE — Telephone Encounter (Signed)
PA completed on Cover My Meds. Awaiting determination. See other phone note.

## 2021-01-21 NOTE — Telephone Encounter (Signed)
Ajovy denied. Checking with Dr Lucia Gaskins to see if we can switch to [facility] and see if insurance will approve.

## 2021-01-21 NOTE — Telephone Encounter (Signed)
° °  Ajovy denied. Will need to switch to another CGRP, perhaps Emgality, if ok with Dr Lucia Gaskins, and see if insurance will approve that instead.

## 2021-01-22 NOTE — Telephone Encounter (Signed)
Per Dr Lucia Gaskins, prescribe Emgality. Prescription sent to pharmacy. Canceled Ajovy. Also called the pt and LVM about this. See other encounter labeled "Ajovy PA" for full documentation.

## 2021-01-22 NOTE — Telephone Encounter (Signed)
I called the pt at [phone] and LVM (ok per DPR) advising that insurance doesn't cover Ajovy so we will try Emgality instead. Advised mychart message would be sent. Advised her of instructions, inject 1 pen into skin when next dose of Ajovy would have been due then continue every 30 days. Order for [facility] sent to pharmacy in the existing refill encounter. Left office number for call back if needed.

## 2021-02-10 ENCOUNTER — Ambulatory Visit: Payer: 59 | Admitting: Neurology

## 2021-02-17 ENCOUNTER — Encounter: Payer: Self-pay | Admitting: Neurology

## 2021-02-17 NOTE — Telephone Encounter (Signed)
Completed urgent PA request for Emgality. KeyOB:[id number]. Awaiting determination from [facility].

## 2021-03-05 NOTE — Telephone Encounter (Signed)
Request Reference Number: [id number]. EMGALITY INJ 120MG /ML is approved through 08/17/2021. Your patient may now fill this prescription and it will be covered.

## 2021-05-31 ENCOUNTER — Encounter: Payer: Self-pay | Admitting: Neurology

## 2021-06-01 NOTE — Telephone Encounter (Signed)
Called [facility] in [place]. They transferred the prescription from the [place] location and was able to process the claim for $0. They will fill for the patient. I have updated the patient.  ?

## 2021-07-08 ENCOUNTER — Telehealth: Payer: Self-pay | Admitting: Neurology

## 2021-07-08 NOTE — Telephone Encounter (Signed)
LVM and sent mychart msg informing pt of r/s needed for 7/3 appt- MD out.

## 2021-07-20 ENCOUNTER — Telehealth: Payer: 59 | Admitting: Neurology

## 2021-09-16 ENCOUNTER — Telehealth (INDEPENDENT_AMBULATORY_CARE_PROVIDER_SITE_OTHER): Payer: Self-pay | Admitting: Neurology

## 2021-09-16 DIAGNOSIS — Z91199 Patient's noncompliance with other medical treatment and regimen due to unspecified reason: Secondary | ICD-10-CM

## 2021-09-16 NOTE — Progress Notes (Signed)
Patient no-showed, second time

## 2021-09-24 ENCOUNTER — Telehealth: Payer: Self-pay | Admitting: *Deleted

## 2021-09-24 NOTE — Telephone Encounter (Signed)
PA Emgality Complete  Did PA over the phone with [facility]. Reference # PA [id number]     Waiting Approval

## 2022-03-30 ENCOUNTER — Other Ambulatory Visit: Payer: Self-pay
